# Patient Record
Sex: Female | Born: 1990 | Race: Black or African American | Hispanic: No | Marital: Single | Smoking: Never smoker
Health system: Southern US, Community
[De-identification: ages and names within clinical notes are randomized; demographics above are authoritative.]

## PROBLEM LIST (undated history)

## (undated) DIAGNOSIS — E282 Polycystic ovarian syndrome: Secondary | ICD-10-CM

## (undated) DIAGNOSIS — E119 Type 2 diabetes mellitus without complications: Secondary | ICD-10-CM

---

## 2019-08-31 ENCOUNTER — Emergency Department (HOSPITAL_COMMUNITY): Payer: Medicaid Other

## 2019-08-31 ENCOUNTER — Emergency Department (HOSPITAL_COMMUNITY)
Admission: EM | Admit: 2019-08-31 | Discharge: 2019-09-01 | Disposition: A | Discharge status: Home / Self Care | Payer: Medicaid Other | Attending: Emergency Medicine | Admitting: Emergency Medicine

## 2019-08-31 ENCOUNTER — Other Ambulatory Visit: Payer: Self-pay

## 2019-08-31 ENCOUNTER — Encounter (HOSPITAL_COMMUNITY): Payer: Self-pay | Admitting: Emergency Medicine

## 2019-08-31 DIAGNOSIS — S93402A Sprain of unspecified ligament of left ankle, initial encounter: Secondary | ICD-10-CM | POA: Insufficient documentation

## 2019-08-31 DIAGNOSIS — Y9355 Activity, bike riding: Secondary | ICD-10-CM | POA: Insufficient documentation

## 2019-08-31 DIAGNOSIS — Y999 Unspecified external cause status: Secondary | ICD-10-CM | POA: Insufficient documentation

## 2019-08-31 DIAGNOSIS — Y929 Unspecified place or not applicable: Secondary | ICD-10-CM | POA: Insufficient documentation

## 2019-08-31 DIAGNOSIS — T1490XA Injury, unspecified, initial encounter: Secondary | ICD-10-CM

## 2019-08-31 DIAGNOSIS — E119 Type 2 diabetes mellitus without complications: Secondary | ICD-10-CM | POA: Diagnosis not present

## 2019-08-31 DIAGNOSIS — S99912A Unspecified injury of left ankle, initial encounter: Secondary | ICD-10-CM | POA: Diagnosis present

## 2019-08-31 HISTORY — DX: Type 2 diabetes mellitus without complications: E11.9

## 2019-08-31 HISTORY — DX: Polycystic ovarian syndrome: E28.2

## 2019-08-31 MED ORDER — IBUPROFEN 400 MG PO TABS
400.0000 mg | ORAL_TABLET | Freq: Once | ORAL | Status: AC | PRN
  Administered 2019-08-31: 400 mg via ORAL
  Filled 2019-08-31 (×2): qty 1

## 2019-08-31 MED ORDER — IBUPROFEN 600 MG PO TABS: 600.0000 mg | ORAL_TABLET | Freq: Four times a day (QID) | ORAL | 0 refills | Status: AC | PRN

## 2019-08-31 NOTE — Discharge Instructions (Addendum)
Ice areas of injury 3-4 times per day to limit inflammation and spasm.  Avoid strenuous activity and heavy lifting.  We recommend consistent use of ibuprofen as prescribed.  Follow-up with a primary care doctor to ensure resolution of symptoms.  Return to the ED for any new or concerning symptoms.
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
